# Patient Record
Sex: Female | Born: 1985 | Race: White | Hispanic: No | Marital: Single | Smoking: Never smoker
Health system: Southern US, Community
[De-identification: ages and names within clinical notes are randomized; demographics above are authoritative.]

## PROBLEM LIST (undated history)

## (undated) DIAGNOSIS — N939 Abnormal uterine and vaginal bleeding, unspecified: Secondary | ICD-10-CM

## (undated) DIAGNOSIS — D649 Anemia, unspecified: Secondary | ICD-10-CM

## (undated) DIAGNOSIS — N920 Excessive and frequent menstruation with regular cycle: Secondary | ICD-10-CM

## (undated) HISTORY — PX: WISDOM TOOTH EXTRACTION: SHX21

---
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
## 2019-01-31 ENCOUNTER — Emergency Department (INDEPENDENT_AMBULATORY_CARE_PROVIDER_SITE_OTHER)
Admission: EM | Admit: 2019-01-31 | Discharge: 2019-01-31 | Disposition: A | Discharge status: Home / Self Care | Payer: Self-pay | Source: Home / Self Care

## 2019-01-31 ENCOUNTER — Other Ambulatory Visit: Payer: Self-pay

## 2019-01-31 DIAGNOSIS — J020 Streptococcal pharyngitis: Secondary | ICD-10-CM

## 2019-01-31 MED ORDER — AMOXICILLIN 500 MG PO CAPS: 500.0000 mg | ORAL_CAPSULE | Freq: Three times a day (TID) | ORAL | 0 refills | Status: AC

## 2019-01-31 NOTE — Discharge Instructions (Addendum)
Return if any problems.

## 2019-01-31 NOTE — ED Provider Notes (Signed)
[facility]    CSN: [id number] Arrival date & time: 01/31/19  1821      History   Chief Complaint Chief Complaint  Patient presents with  . Sore Throat  . Otalgia    LT    HPI Colman CaterKerri Powell is a 33 y.o. female.   The history is provided by the patient. No language interpreter was used.  Sore Throat This is a new problem. The current episode started yesterday. The problem occurs constantly. Pertinent negatives include no abdominal pain. Nothing aggravates the symptoms. Nothing relieves the symptoms. She has tried nothing for the symptoms. The treatment provided no relief.  Otalgia Associated symptoms: no abdominal pain   Pt complains of a sore throat.  Pt has a child who has strep  History reviewed. No pertinent past medical history.  There are no active problems to display for this patient.   History reviewed. No pertinent surgical history.  OB History   No obstetric history on file.      Home Medications    Prior to Admission medications   Medication Sig Start Date End Date Taking? Authorizing Provider  amoxicillin (AMOXIL) 500 MG capsule Take 1 capsule (500 mg total) by mouth 3 (three) times daily. 01/31/19   Elson AreasSofia, Misty K, PA-C    Family History History reviewed. No pertinent family history.  Social History Social History   Tobacco Use  . Smoking status: Never Smoker  . Smokeless tobacco: Never Used  Substance Use Topics  . Alcohol use: Not on file  . Drug use: Not on file     Allergies   Patient has no known allergies.   Review of Systems Review of Systems  HENT: Positive for ear pain.   Gastrointestinal: Negative for abdominal pain.  All other systems reviewed and are negative.    Physical Exam Triage Vital Signs ED Triage Vitals [01/31/19 1849]  Enc Vitals Group     BP 117/81     Pulse Rate (!) 101     Resp 18     Temp 98.2 F (36.8 C)     Temp Source Oral     SpO2 99 %     Weight 138 lb (62.6 kg)     Height 5\' 5"   (1.651 m)     Head Circumference      Peak Flow      Pain Score 0     Pain Loc      Pain Edu?      Excl. in GC?    No data found.  Updated Vital Signs BP 117/81 (BP Location: Right Arm)   Pulse (!) 101   Temp 98.2 F (36.8 C) (Oral)   Resp 18   Ht 5\' 5"  (1.651 m)   Wt 62.6 kg   SpO2 99%   BMI 22.96 kg/m   Visual Acuity Right Eye Distance:   Left Eye Distance:   Bilateral Distance:    Right Eye Near:   Left Eye Near:    Bilateral Near:     Physical Exam Vitals signs and nursing note reviewed.  Constitutional:      Appearance: She is well-developed.  HENT:     Head: Normocephalic.     Mouth/Throat:     Mouth: Mucous membranes are moist.     Pharynx: Pharyngeal swelling and posterior oropharyngeal erythema present.     Tonsils: 0 on the right. 0 on the left.  Neck:     Musculoskeletal: Normal range of motion.  Pulmonary:     Effort: Pulmonary effort is normal.  Abdominal:     General: There is no distension.  Musculoskeletal: Normal range of motion.  Neurological:     Mental Status: She is alert and oriented to person, place, and time.      UC Treatments / Results  Labs (all labs ordered are listed, but only abnormal results are displayed) Labs Reviewed  POCT RAPID STREP A (OFFICE)    EKG   Radiology No results found.  Procedures Procedures (including critical care time)  Medications Ordered in UC Medications - No data to display  Initial Impression / Assessment and Plan / UC Course  I have reviewed the triage vital signs and the nursing notes.  Pertinent labs & imaging results that were available during my care of the patient were reviewed by me and considered in my medical decision making (see chart for details).     Strep positive Final Clinical Impressions(s) / UC Diagnoses   Final diagnoses:  Strep pharyngitis     Discharge Instructions     Return if any problems   ED Prescriptions    Medication Sig Dispense Auth.  Provider   amoxicillin (AMOXIL) 500 MG capsule Take 1 capsule (500 mg total) by mouth 3 (three) times daily. 30 capsule Misty Powell, [place]     Controlled Substance Prescriptions [place] Controlled Substance Registry consulted? Not Applicable  An After Visit Summary was printed and given to the patient.    Misty Powell, [place] 01/31/19 1926

## 2019-01-31 NOTE — ED Triage Notes (Signed)
Pt c/o sore throat x 2 days. Also LT ear pain/pressure. Possible low grade fever (99.) at home. Kids were dx with strep on Monday. Requesting rapid strep.

## 2019-06-21 ENCOUNTER — Emergency Department (INDEPENDENT_AMBULATORY_CARE_PROVIDER_SITE_OTHER)
Admission: EM | Admit: 2019-06-21 | Discharge: 2019-06-21 | Disposition: A | Discharge status: Home / Self Care | Payer: No Typology Code available for payment source | Source: Home / Self Care | Attending: Family Medicine | Admitting: Family Medicine

## 2019-06-21 ENCOUNTER — Emergency Department (INDEPENDENT_AMBULATORY_CARE_PROVIDER_SITE_OTHER): Payer: No Typology Code available for payment source

## 2019-06-21 ENCOUNTER — Other Ambulatory Visit: Payer: Self-pay

## 2019-06-21 DIAGNOSIS — M940 Chondrocostal junction syndrome [Tietze]: Secondary | ICD-10-CM

## 2019-06-21 DIAGNOSIS — Z8616 Personal history of COVID-19: Secondary | ICD-10-CM | POA: Diagnosis not present

## 2019-06-21 DIAGNOSIS — U071 COVID-19: Secondary | ICD-10-CM

## 2019-06-21 DIAGNOSIS — J069 Acute upper respiratory infection, unspecified: Secondary | ICD-10-CM | POA: Diagnosis not present

## 2019-06-21 HISTORY — DX: Anemia, unspecified: D64.9

## 2019-06-21 HISTORY — DX: Abnormal uterine and vaginal bleeding, unspecified: N93.9

## 2019-06-21 LAB — POCT CBC W AUTO DIFF (K'VILLE URGENT CARE)

## 2019-06-21 LAB — POCT FASTING CBG KUC MANUAL ENTRY: POCT Glucose (KUC): 123 mg/dL — AB (ref 70–99)

## 2019-06-21 MED ORDER — AZITHROMYCIN 250 MG PO TABS: ORAL_TABLET | ORAL | 0 refills | Status: AC

## 2019-06-21 NOTE — Discharge Instructions (Addendum)
Take plain guaifenesin (1200mg  extended release tabs such as Mucinex) twice daily, with plenty of water, for cough and congestion.  May add Pseudoephedrine (30mg , one or two every 4 to 6 hours) for sinus congestion.  Get adequate rest.   May use Afrin nasal spray (or generic oxymetazoline) each morning for about 5 days and then discontinue.  Also recommend using saline nasal spray several times daily and saline nasal irrigation (AYR is a common brand).  Use Flonase nasal spray each morning after using Afrin nasal spray and saline nasal irrigation. Try warm salt water gargles for sore throat.  Stop all antihistamines for now, and other non-prescription cough/cold preparations. May take Ibuprofen 200mg , 4 tabs every 8 hours with food for chest/sternum discomfort. May take Delsym Cough Suppressant at bedtime for nighttime cough.   Try applying an ice pack to sternum for 20 to 30 minutes, 3 to 4 times daily  Continue until pain decreases.

## 2019-06-21 NOTE — ED Provider Notes (Signed)
[facility]    CSN: [id number] Arrival date & time: 06/21/19  1332      History   Chief Complaint Chief Complaint  Patient presents with  . Cough    HPI Misty Powell is a 34 y.o. female.   Patient reports that she was diagnosed with COVID19 two weeks ago and had generally been improving.  She has had a persistent mild cough which increased 3 days ago.  She has had recurrent chills/sweats.  Today her congestion, cough, fatigue,and headache have become worse.  She feels tight in her anterior chest.  On several occasions she has felt weak, improved with a snack.  The history is provided by the patient.    Past Medical History:  Diagnosis Date  . Anemia   . Excessive vaginal bleeding     There are no problems to display for this patient.   Past Surgical History:  Procedure Laterality Date  . WISDOM TOOTH EXTRACTION      OB History   No obstetric history on file.      Home Medications    Prior to Admission medications   Medication Sig Start Date End Date Taking? Authorizing Provider  amoxicillin (AMOXIL) 500 MG capsule Take 1 capsule (500 mg total) by mouth 3 (three) times daily. 01/31/19   Elson Areas, PA-C  azithromycin (ZITHROMAX Z-PAK) 250 MG tablet Take 2 tabs today; then begin one tab once daily for 4 more days. 06/21/19   Lattie Haw, MD    Family History History reviewed. No pertinent family history.  Social History Social History   Tobacco Use  . Smoking status: Never Smoker  . Smokeless tobacco: Never Used  Substance Use Topics  . Alcohol use: Not Currently  . Drug use: Not Currently     Allergies   Patient has no known allergies.   Review of Systems Review of Systems No sore throat + cough No pleuritic pain, but feels tight in anterior chest No wheezing + nasal congestion + post-nasal drainage No sinus pain/pressure No itchy/red eyes No earache No hemoptysis No SOB No fever, + chills/sweats + nausea No  vomiting No abdominal pain + diarrhea No urinary symptoms No skin rash + fatigue No myalgias + headache    Physical Exam Triage Vital Signs ED Triage Vitals  Enc Vitals Group     BP 06/21/19 1432 111/81     Pulse Rate 06/21/19 1432 (!) 101     Resp 06/21/19 1432 (!) 22     Temp 06/21/19 1432 98.5 F (36.9 C)     Temp Source 06/21/19 1432 Oral     SpO2 06/21/19 1432 100 %     Weight --      Height 06/21/19 1428 5\' 5"  (1.651 m)     Head Circumference --      Peak Flow --      Pain Score 06/21/19 1428 7     Pain Loc --      Pain Edu? --      Excl. in GC? --    No data found.  Updated Vital Signs BP 111/81 (BP Location: Left Arm)   Pulse (!) 101   Temp 98.5 F (36.9 C) (Oral)   Resp (!) 22   Ht 5\' 5"  (1.651 m)   LMP 05/28/2019   SpO2 100%   BMI 22.96 kg/m   Visual Acuity Right Eye Distance:   Left Eye Distance:   Bilateral Distance:    Right Eye Near:  Left Eye Near:    Bilateral Near:     Physical Exam Nursing notes and Vital Signs reviewed. Appearance:  Patient appears stated age, and in no acute distress Eyes:  Pupils are equal, round, and reactive to light and accomodation.  Extraocular movement is intact.  Conjunctivae are not inflamed  Ears:  Canals normal.  Tympanic membranes normal.  Nose:  Mildly congested turbinates.  No sinus tenderness.  Pharynx:  Normal Neck:  Supple.  Mildly enlarged lateral nodes are present, tender to palpation on the left.   Lungs:  Clear to auscultation.  Breath sounds are equal.  Moving air well. Chest:  Distinct tenderness to palpation over the mid-sternum.  Heart:  Regular rate and rhythm without murmurs, rubs, or gallops.  Abdomen:  Nontender without masses or hepatosplenomegaly.  Bowel sounds are present.  No CVA or flank tenderness.  Extremities:  No edema.  Skin:  No rash present.   UC Treatments / Results  Labs (all labs ordered are listed, but only abnormal results are displayed) Labs Reviewed  POCT  FASTING CBG [facility] MANUAL ENTRY - Abnormal; Notable for the following components:      Result Value   POCT Glucose ([facility]) 123 (*)    All other components within normal limits  POCT CBC W AUTO DIFF ([facility]):  WBC 6.2; LY 29.8; MO 9.0; GR 61.2; Hgb 11.6; Platelets 266     EKG   Radiology DG Chest 2 View  Result Date: 06/21/2019 CLINICAL DATA:  34 year old who tested positive for COVID-19 approximately 2 weeks ago, presenting with cough, chest congestion, fatigue and headaches which acutely worsened today. EXAM: CHEST - 2 VIEW COMPARISON:  None. FINDINGS: Cardiomediastinal silhouette unremarkable. Lungs clear. Bronchovascular markings normal. Pulmonary vascularity normal. No visible pleural effusions. No pneumothorax. Pectus excavatum sternal deformity. Slight upper lumbar levoscoliosis and slight compensatory thoracolumbar dextroscoliosis. IMPRESSION: No acute cardiopulmonary disease. Electronically Signed   By: Hulan Saas M.D.   On: 06/21/2019 15:09    Procedures Procedures (including critical care time)  Medications Ordered in UC Medications - No data to display  Initial Impression / Assessment and Plan / UC Course  I have reviewed the triage vital signs and the nursing notes.  Pertinent labs & imaging results that were available during my care of the patient were reviewed by me and considered in my medical decision making (see chart for details).    Negative chest x-ray and normal WBC reassuring. Suspect new viral URI.  Will begin empiric Z-pak (patient probably somewhat immune suppressed after COVID19 infection). Followup with Family Doctor if not improved in one week.    Final Clinical Impressions(s) / UC Diagnoses   Final diagnoses:  History of COVID-19  Viral URI with cough  Costochondritis     Discharge Instructions     Take plain guaifenesin (1200mg  extended release tabs such as Mucinex) twice daily, with plenty of water, for cough and congestion.  May  add Pseudoephedrine (30mg , one or two every 4 to 6 hours) for sinus congestion.  Get adequate rest.   May use Afrin nasal spray (or generic oxymetazoline) each morning for about 5 days and then discontinue.  Also recommend using saline nasal spray several times daily and saline nasal irrigation (AYR is a common brand).  Use Flonase nasal spray each morning after using Afrin nasal spray and saline nasal irrigation. Try warm salt water gargles for sore throat.  Stop all antihistamines for now, and other non-prescription cough/cold preparations. May take Ibuprofen 200mg ,  4 tabs every 8 hours with food for chest/sternum discomfort. May take Delsym Cough Suppressant at bedtime for nighttime cough.   Try applying an ice pack to sternum for 20 to 30 minutes, 3 to 4 times daily  Continue until pain decreases.     ED Prescriptions    Medication Sig Dispense Auth. Provider   azithromycin (ZITHROMAX Z-PAK) 250 MG tablet Take 2 tabs today; then begin one tab once daily for 4 more days. 6 tablet Kandra Nicolas, MD        Kandra Nicolas, MD 06/23/19 [phone]

## 2019-06-21 NOTE — ED Triage Notes (Signed)
Congestion cough fatigue headache worse today.  Dx with Covid 2 weeks ago.

## 2020-08-07 ENCOUNTER — Other Ambulatory Visit: Payer: No Typology Code available for payment source

## 2021-01-10 IMAGING — DX DG CHEST 2V
2 series · 2 of 2 positions shown · non-contrast
Comparison: None.

CLINICAL DATA: 33-year-old who tested positive for [id number]
approximately 2 weeks ago, presenting with cough, chest congestion,
fatigue and headaches which acutely worsened today.

EXAM:
CHEST - 2 VIEW

[chest pa]
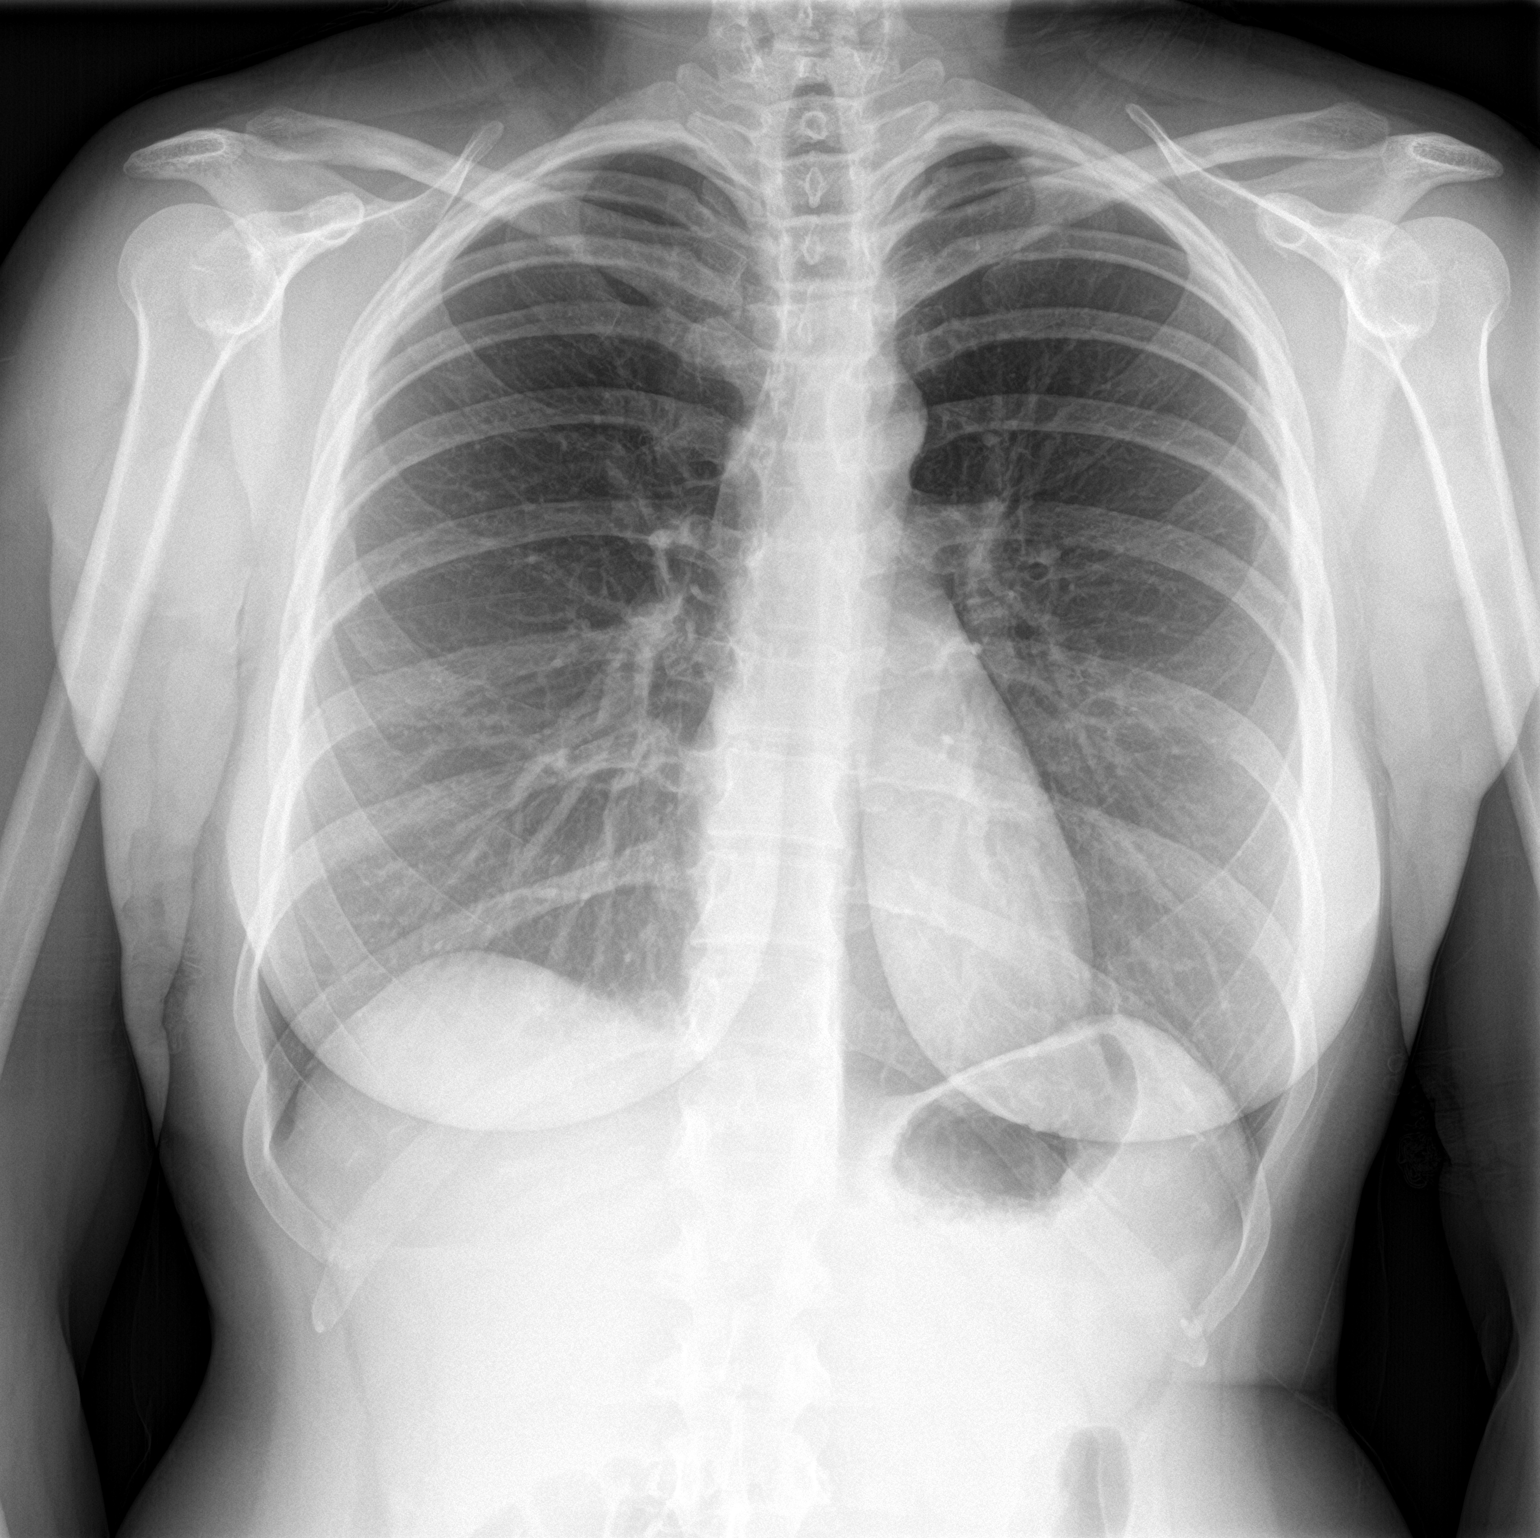

[chest lat]
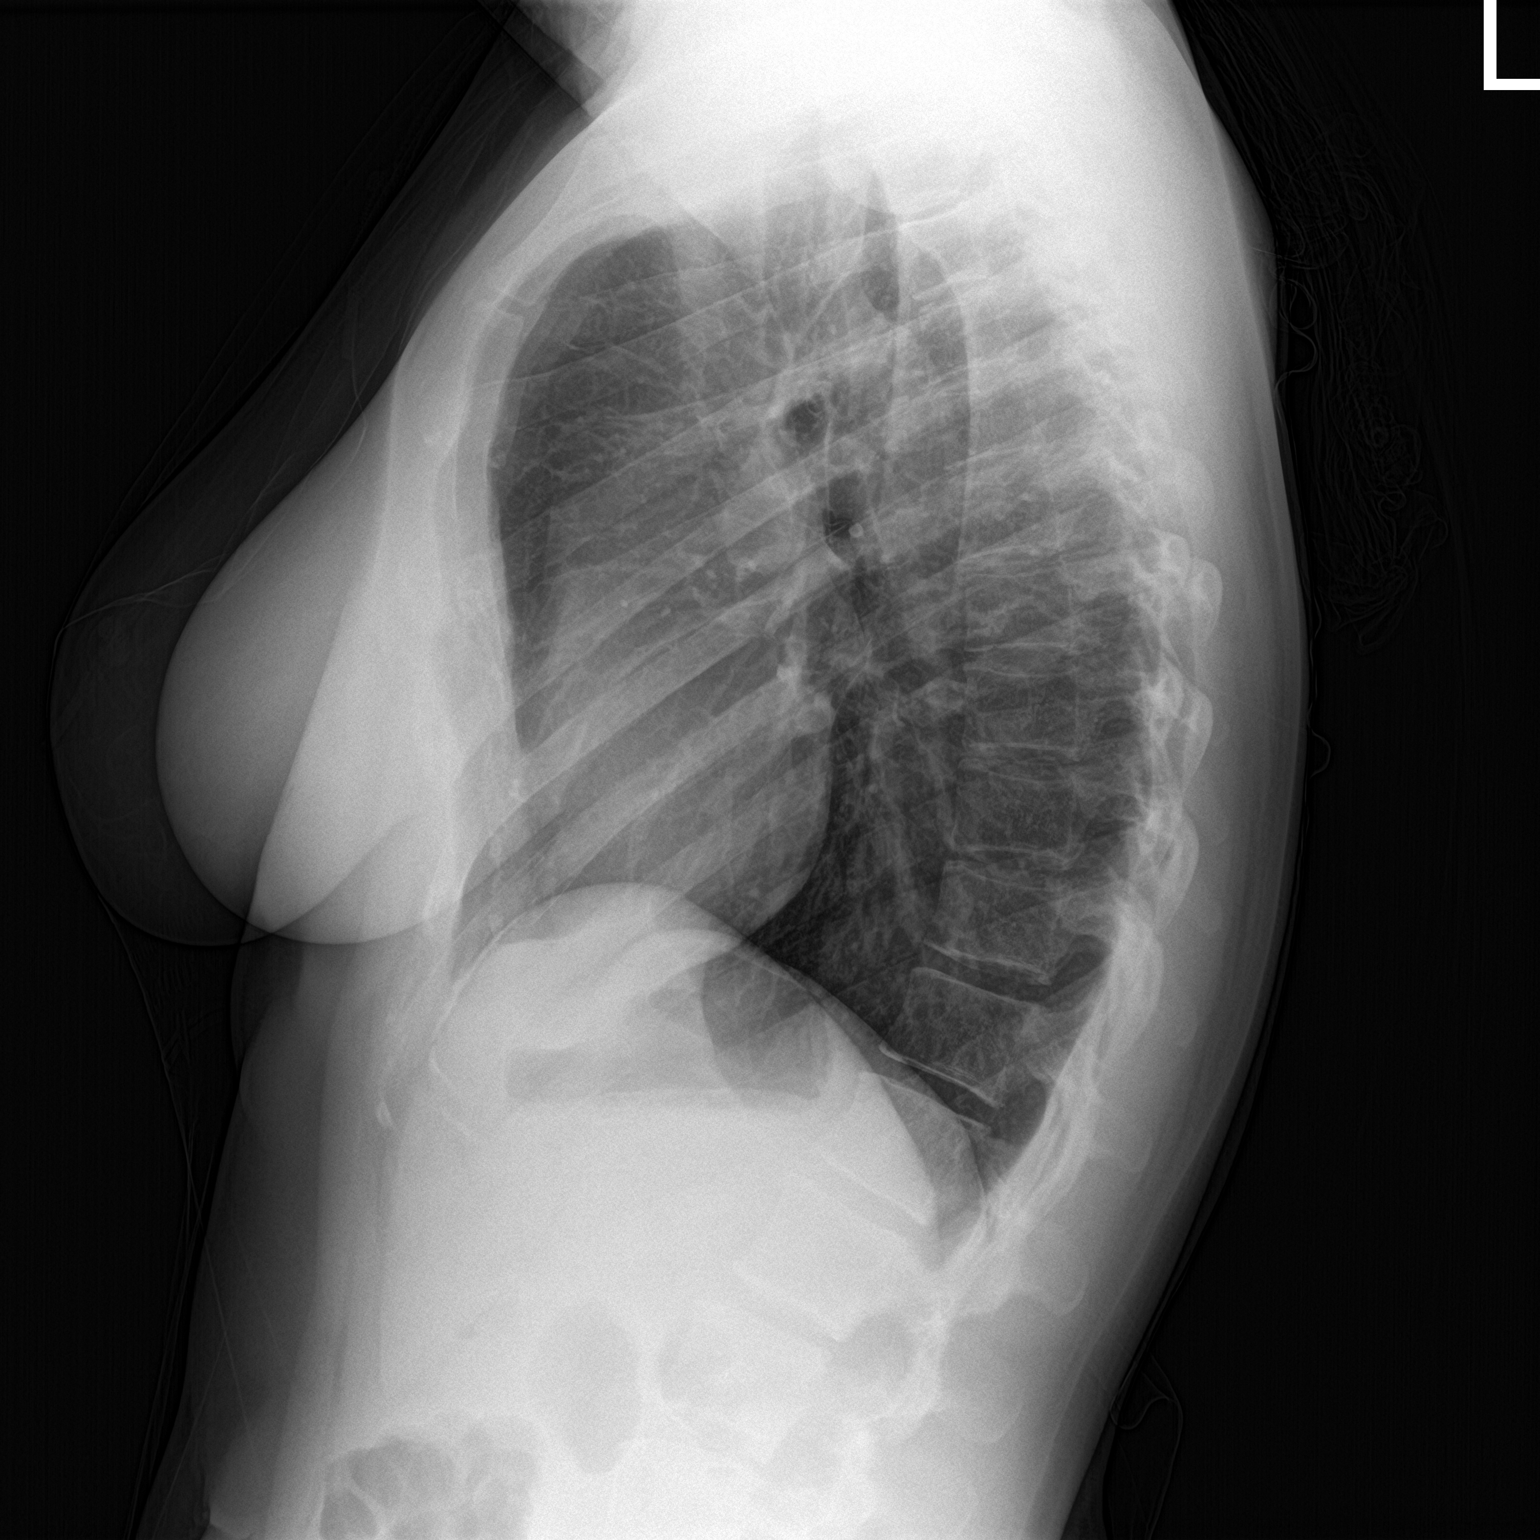

[2 of 2 positions shown; findings below may reference images not displayed]

FINDINGS: Cardiomediastinal silhouette unremarkable. Lungs clear.
Bronchovascular markings normal. Pulmonary vascularity normal. No
visible pleural effusions. No pneumothorax. Pectus excavatum sternal
deformity. Slight upper lumbar levoscoliosis and slight compensatory
thoracolumbar dextroscoliosis.
IMPRESSION: No acute cardiopulmonary disease.
